# Patient Record
Sex: Male | Born: 1985 | Hispanic: Yes | Smoking: Never smoker
Health system: Southern US, Community
[De-identification: ages and names within clinical notes are randomized; demographics above are authoritative.]

## PROBLEM LIST (undated history)

## (undated) HISTORY — PX: OTHER SURGICAL HISTORY: SHX169

---

## 2015-03-02 ENCOUNTER — Emergency Department (HOSPITAL_COMMUNITY)
Admission: EM | Admit: 2015-03-02 | Discharge: 2015-03-02 | Disposition: A | Discharge status: Home / Self Care | Payer: Self-pay | Attending: Emergency Medicine | Admitting: Emergency Medicine

## 2015-03-02 ENCOUNTER — Encounter (HOSPITAL_COMMUNITY): Payer: Self-pay | Admitting: Emergency Medicine

## 2015-03-02 DIAGNOSIS — A6920 Lyme disease, unspecified: Secondary | ICD-10-CM | POA: Insufficient documentation

## 2015-03-02 DIAGNOSIS — Z720 Tobacco use: Secondary | ICD-10-CM | POA: Insufficient documentation

## 2015-03-02 MED ORDER — DOXYCYCLINE HYCLATE 100 MG PO CAPS: 100.0000 mg | ORAL_CAPSULE | Freq: Two times a day (BID) | ORAL | Status: DC

## 2015-03-02 NOTE — ED Notes (Signed)
Large, red, raised, itching rash on l/lower leg. Area is a  7cm diameter, round ring. No other rash noted

## 2015-03-02 NOTE — Discharge Instructions (Signed)
Read the information below.  Use the prescribed medication as directed.  Please discuss all new medications with your pharmacist.  You may return to the Emergency Department at any time for worsening condition or any new symptoms that concern you.  If you develop fevers, body aches, vomiting, or difficulty breathing or swallowing return to the ER for a recheck.       Emergency Department Resource Guide 1) Find a Doctor and Pay Out of Pocket Although you won't have to find out who is covered by your insurance plan, it is a good idea to ask around and get recommendations. You will then need to call the office and see if the doctor you have chosen will accept you as a new patient and what types of options they offer for patients who are self-pay. Some doctors offer discounts or will set up payment plans for their patients who do not have insurance, but you will need to ask so you aren't surprised when you get to your appointment.  2) Contact Your Local Health Department Not all health departments have doctors that can see patients for sick visits, but many do, so it is worth a call to see if yours does. If you don't know where your local health department is, you can check in your phone book. The CDC also has a tool to help you locate your state's health department, and many state websites also have listings of all of their local health departments.  3) Find a Walk-in Clinic If your illness is not likely to be very severe or complicated, you may want to try a walk in clinic. These are popping up all over the country in pharmacies, drugstores, and shopping centers. They're usually staffed by nurse practitioners or physician assistants that have been trained to treat common illnesses and complaints. They're usually fairly quick and inexpensive. However, if you have serious medical issues or chronic medical problems, these are probably not your best option.  No Primary Care Doctor: - Call Health Connect at   [phone] - they can help you locate a primary care doctor that  accepts your insurance, provides certain services, etc. - Physician Referral Service- [phone]  Chronic Pain Problems: Organization         Address  Phone   Notes  [facility]  [phone] Patients need to be referred by their primary care doctor.   Medication Assistance: Organization         Address  Phone   Notes  [facility] [address] [place], [place] [address] [phone] --Must be a resident of [facility] [place] -- Must have NO insurance coverage whatsoever (no Medicaid/ Medicare, etc.) -- The pt. MUST have a primary care doctor that directs their care regularly and follows them in the community   MedAssist  [phone]) [phone]   [employer]  [phone]    Agencies that provide inexpensive medical care: Organization         Address  Phone   Notes  [facility]  [phone]   [facility]    [phone]   [facility] [address] [place], [place] [address] [phone]   [facility] [address], [place] [phone]   Planned Parenthood    [phone]) [phone]   [facility]    [phone]) [phone]   Community Health and [facility]  [address]  Mardene Speak Phone:  [phone], Fax:  [phone] Hours of Operation:  9 am - 6 pm, M-F.  Also accepts Medicaid/Medicare and self-pay.  [facility] for [place], [address], [place] Phone: [phone], Fax: [phone]. Hours of Operation:  8:30 am - 5:30 pm, M-F.  Also accepts Medicaid and self-pay.  [facility] [address] [place] Phone: [phone]   [place], [place], [place] [phone], Ext. [phone] Mondays & Thursdays: 7-9 AM.  First 15 patients are seen on a first come, first serve basis.     [place] Providers:  Organization         Address  Phone   Notes  [facility] [address], [place] [phone] Also accepts self-pay patients.  [facility] [id number] [place], [place]  [phone]   [place], [address], [place] [phone]   [facility] [address], [place] [phone]   Lucianne Lei [address], [place]   [phone] Only accepts [place] Access [place] patients after they have their name applied to their card.   Self-Pay (no insurance) in [facility]:  Organization         Address  Phone   Notes  Sickle Cell Patients, [facility] [place] [phone]   [facility] [place] [phone]   [facility]  [place], [place], [place] [phone]   [facility]/Dr. Osei-Bonsu  [address] [place] or [address], [place] [phone] Phone number for both [place] and [place] locations is the same.  Urgent Medical and [facility] [address], [place] [phone]   [facility] [address] [place] or [address] [phone]   [facility] [address], [place] [phone], phone; [phone], fax Sees patients 1st and 3rd Saturday of every month.  Must not qualify for public or private insurance (i.e. Medicaid, Medicare, [place] Health Choice, Veterans' Benefits)  Household income should be no more than 200% of the poverty level The clinic cannot treat you if you are pregnant or think you are pregnant  Sexually transmitted diseases are not treated at the clinic.    Dental Care: Organization         Address  Phone  Notes  [facility]  Department of [facility] [place] [phone] Accepts children up to age 58 who are enrolled in [place] or [place]; pregnant women with a Medicaid card; and children who have applied for Medicaid or [place] Health Choice, but were declined, whose parents can pay a reduced fee at time of service.  [facility] Department of [facility]  [address], [place] [phone] Accepts children up to age 34 who are enrolled in [place] or [place]; pregnant women with a Medicaid card; and children who have applied for Medicaid or [place] Health Choice, but were declined, whose parents can pay a reduced fee at time of service.  [employer] Adult Dental Access PROGRAM  [place] [phone] Patients are seen  by appointment only. Walk-ins are not accepted. [place] will see patients 43 years of age and older. Monday - Tuesday (8am-5pm) Most Wednesdays (8:30-5pm) $30 per visit, cash only  [facility] Adult Dental Access PROGRAM  [address], [facility] [phone] Patients are seen by appointment only. Walk-ins are not accepted. [place] will see patients 22 years of age and older. One Wednesday Evening (Monthly: Volunteer Based).  $30 per visit, cash only  [place]  [phone] for adults; Children under age 43, call Graduate Pediatric Dentistry at [phone]. Children aged 30-14, please call [phone] to request a pediatric application.  Dental services are provided in all areas of dental care including fillings, crowns and bridges, complete and partial dentures, implants, gum treatment, root canals, and extractions. Preventive care is also provided. Treatment is provided to both adults and children. Patients are selected via a lottery and there is often a waiting list.   [facility] [address], [place]  [phone]  [email or web address].drcivils.com   [facility] [address] [place], [place] [phone], Ext. [phone] Second and Fourth Thursday of each month, opens at 6:30 AM; Clinic ends at 9 AM.  Patients are seen on a first-come first-served basis, and a limited number are seen during each clinic.   [facility]  [address] Hillard Danker [place], [place] [phone]   Eligibility Requirements You must have lived in [place], [place], or [place] counties for at least the last three months.   You cannot be eligible for state or federal sponsored [employer], including [employer], [place], or [employer].   You generally cannot be eligible for healthcare insurance through your employer.    How to apply: Eligibility screenings are held every Tuesday and Wednesday afternoon from 1:00 pm until 4:00 pm. You do not need an appointment for the interview!  [facility] [address], [place], [place]   [place]  [place] Department  [place]  [phone]    Behavioral Health Resources in the Community: Intensive Outpatient Programs Organization         Address  Phone  Notes  [facility] [place]. [address], [place], [place] [phone]   [facility] Outpatient [address] [place], [place]   ADS: Alcohol & Drug Svcs [address] [place], [place]   [place] [address]  [place], [place] or [phone]   Substance Abuse Resources Organization         Address  Phone  Notes  Alcohol and Drug Services  [phone]   [place]  [phone]   [facility] [place]   Chinita Pester  [phone]   Residential & Outpatient Substance Abuse Program  [phone]   Psychological Services Organization          Address  Phone  Notes  [facility]  [place]  [phone]   [place] [address] [place] or [phone]    Mobile Crisis Teams Organization         Address  Phone  Notes  [facility]rapeutic Alternatives, Mobile Crisis Care Unit  [phone]   Assertive Psychotherapeutic Services  [address] [place], [place]   [facility] [address] [place]  [phone]    Self-Help/Support Groups Organization         Address  Phone             Notes  Mental Health Assoc. of [place] - variety of support groups  [place] Call for more information  Narcotics Anonymous (NA), [facility]aring [facility] [address], [employer] [place]  2 meetings at this location   Special educational needs teacher         Address  Phone  Notes  ASAP Residential Treatment [place],    [place]  [phone]   [facility]  [address], [place] [id number], [place], [place]   [place], [place] [phone] Admissions: 8am-3pm M-F  Incentives Substance [place] [address]    [place], [place] [phone]   [facility] [address] [place], [place], [place]   [facility] [address]  [place], [place]   [facility] Programs - Intensive Outpatient [address], [place], [place]   [facility] ([place].,  [place], [place] [phone] or [phone]   [facility] Services (RTS) [address], [place], [place] Accepts Medicaid  [facility] [place] [address]  [place] [phone] Substance Abuse/Addiction Treatment   [facility] Organization         Address  Phone  Notes  [facility]  [phone]   Domenic Schwab, PhD [address] Arlis Porta [place], [place]   [phone] or [phone]   [place] Worden K[address] [place], [place] [phone]   [facility] [address], [place], [place] [phone] Insurance/Medicaid/sponsorship through [facility] and Families [address]                                    [place], [place] [phone] [place] [address], [place] [phone]    Dr. Adele Schilder  [phone]   [facility] Dept. 1) [address] [place] 2) [place] 3)  [place] [address], [place] [phone]  [phone]   [place] [phone] or [phone] (After Hours)

## 2015-03-02 NOTE — ED Provider Notes (Signed)
CSN: [id number]     Arrival date & time 03/02/15  1042 History   First MD Initiated Contact with Patient 03/02/15 1056     Chief Complaint  Patient presents with  . Rash    4 day hx of large raised rash on l/lower leg     (Consider location/radiation/quality/duration/timing/severity/associated sxs/prior Treatment) The history is provided by the patient.   Pt presents with single skin lesion to his left lower leg.  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  The lesion does not itch or hurt.  He denies any injury to the area.  Denies fevers, chills, SOB, cough, joint pain, myalgias.  He has used no treatment for the lesion.     History reviewed. No pertinent past medical history. Past Surgical History  Procedure Laterality Date  . Elbow sugery     History reviewed. No pertinent family history. History  Substance Use Topics  . Smoking status: Current Some Day Smoker  . Smokeless tobacco: Not on file  . Alcohol Use: Yes    Review of Systems  All other systems reviewed and are negative.     Allergies  Review of patient's allergies indicates no known allergies.  Home Medications   Prior to Admission medications   Not on File   BP 115/69 mmHg  Pulse 63  Temp(Src) 98.2 F (36.8 C) (Oral)  Resp 16  Wt 140 lb (63.504 kg)  SpO2 98% Physical Exam  Constitutional: He appears well-developed and well-nourished. No distress.  HENT:  Head: Normocephalic and atraumatic.  Neck: Neck supple.  Cardiovascular: Normal rate and regular rhythm.   Pulmonary/Chest: Effort normal and breath sounds normal. No respiratory distress. He has no wheezes. He has no rales.  Neurological: He is alert.  Skin: He is not diaphoretic.     Psychiatric: He has a normal mood and affect.  Nursing note and vitals reviewed.      ED Course  Procedures (including critical care time) Labs Review Labs Reviewed - No data to display  Imaging Review No  results found.   EKG Interpretation None      MDM   Final diagnoses:  Erythema migrans (Lyme disease)    Afebrile, nontoxic patient with single lesion, as above.  Concern for erythema migrans.  Pt without symptoms.   D/C home with doxycycline.  Discussed result, findings, treatment, and follow up  with patient.  Pt given return precautions.  Pt verbalizes understanding and agrees with plan.         Trixie Dredgemily Prathik Aman, PA-C 03/02/15 1418  Nelva Nayobert Beaton, MD 03/04/15 [phone]

## 2015-03-02 NOTE — Progress Notes (Signed)
CM spoke with pt who confirms self pay [place] with no [place]cp.  CM discussed and provided written information for self pay pcps, discussed the importance of pcp vs EDP services for f/u care, www.needymeds.org, www.goodrx.com, discounted pharmacies and other [place] resources such as [facility] , [facility], affordable care act,  [place] med assist, financial assistance, self pay dental services, [place] med assist, DSS and  health department  Reviewed resources for [place] self pay pcps like Jovita KussmaulEvans [place], family medicine at [address], [facility], [facility], local urgent care centers, [facility], [facility] family practice, general medical clinics, family services of the [place], [facility] urgent care plus others, medication resources, [facility] out patient pharmacies and housing Pt voiced understanding and appreciation of resources provided   Provided P4CC contact information Pt states he speaks little English Pt given a Spanish translates list of the above mentioned resources

## 2020-10-02 ENCOUNTER — Other Ambulatory Visit: Payer: Self-pay

## 2020-10-02 ENCOUNTER — Encounter (HOSPITAL_BASED_OUTPATIENT_CLINIC_OR_DEPARTMENT_OTHER): Payer: Self-pay | Admitting: Emergency Medicine

## 2020-10-02 ENCOUNTER — Emergency Department (HOSPITAL_BASED_OUTPATIENT_CLINIC_OR_DEPARTMENT_OTHER): Payer: No Typology Code available for payment source

## 2020-10-02 ENCOUNTER — Emergency Department (HOSPITAL_BASED_OUTPATIENT_CLINIC_OR_DEPARTMENT_OTHER)
Admission: EM | Admit: 2020-10-02 | Discharge: 2020-10-02 | Disposition: A | Discharge status: Home / Self Care | Payer: No Typology Code available for payment source | Attending: Emergency Medicine | Admitting: Emergency Medicine

## 2020-10-02 DIAGNOSIS — S39012A Strain of muscle, fascia and tendon of lower back, initial encounter: Secondary | ICD-10-CM

## 2020-10-02 DIAGNOSIS — S3992XA Unspecified injury of lower back, initial encounter: Secondary | ICD-10-CM | POA: Diagnosis present

## 2020-10-02 DIAGNOSIS — Y92410 Unspecified street and highway as the place of occurrence of the external cause: Secondary | ICD-10-CM | POA: Diagnosis not present

## 2020-10-02 LAB — URINALYSIS, ROUTINE W REFLEX MICROSCOPIC
Bilirubin Urine: NEGATIVE
Glucose, UA: NEGATIVE mg/dL
Hgb urine dipstick: NEGATIVE
Ketones, ur: NEGATIVE mg/dL
Leukocytes,Ua: NEGATIVE
Nitrite: NEGATIVE
Protein, ur: NEGATIVE mg/dL
Specific Gravity, Urine: 1.03 (ref 1.005–1.030)
pH: 6 (ref 5.0–8.0)

## 2020-10-02 MED ORDER — IBUPROFEN 400 MG PO TABS
400.0000 mg | ORAL_TABLET | Freq: Once | ORAL | Status: AC
  Administered 2020-10-02: 400 mg via ORAL
  Filled 2020-10-02: qty 1
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
## 2020-10-02 NOTE — Discharge Instructions (Addendum)
Aplique hielo durante treinta minutos a la vez, cuatro veces al [employer].  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 Si necesita un alivio adicional del dolor, agregue acetaminofn.
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
## 2020-10-02 NOTE — ED Notes (Signed)
Patient verbalizes understanding of discharge instructions. Opportunity for questioning and answers were provided. Armband removed by staff, pt discharged from ED ambulatory to home.  

## 2021-05-10 ENCOUNTER — Other Ambulatory Visit: Payer: Self-pay

## 2021-05-11 ENCOUNTER — Ambulatory Visit: Payer: Self-pay | Admitting: Family Medicine

## 2021-07-01 ENCOUNTER — Telehealth (INDEPENDENT_AMBULATORY_CARE_PROVIDER_SITE_OTHER): Payer: Self-pay | Admitting: Nurse Practitioner

## 2021-07-01 ENCOUNTER — Other Ambulatory Visit: Payer: Self-pay

## 2021-07-01 DIAGNOSIS — G8929 Other chronic pain: Secondary | ICD-10-CM

## 2021-07-01 DIAGNOSIS — M2351 Chronic instability of knee, right knee: Secondary | ICD-10-CM

## 2021-07-01 DIAGNOSIS — M25561 Pain in right knee: Secondary | ICD-10-CM

## 2021-07-01 NOTE — Progress Notes (Signed)
Virtual Visit via Telephone Note  I connected with Miguel Jefferson on 07/01/21 at 10:40 AM EDT by telephone and verified that I am speaking with the correct person using two identifiers.  Location: Patient: home Provider: office   I discussed the limitations, risks, security and privacy concerns of performing an evaluation and management service by telephone and the availability of in person appointments. I also discussed with the patient that there may be a patient responsible charge related to this service. The patient expressed understanding and agreed to proceed.   History of Present Illness:  Patient presents today for right knee pain through telephone visit.  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  He states that he also noticed some right knee instability.  He states it feels like his knee dislocates at times. Denies f/c/s, n/v/d, hemoptysis, PND, chest pain or edema.       Observations/Objective:  Vitals with BMI 10/02/2020 10/02/2020 10/02/2020  Height - - 5' 6"  Weight - - 140 lbs  BMI - - 12.87  Systolic 867 672 -  Diastolic 88 93 -  Pulse 66 80 -      Assessment and Plan:  Right knee pain:  Will place referral to orthopedics for further evaluation  May use knee sleeve for comfort and stability  May alternate heat and ice  Follow up:  Follow up with PCP in 2-3 weeks  Patient Instructions  Right knee pain:  Will place referral to orthopedics for further evaluation  May use knee sleeve for comfort and stability  May alternate heat and ice  Follow up:  Follow up with PCP in 2-3 weeks  Chronic Knee Pain, Adult Knee pain that lasts longer than 3 months is called chronic knee pain. You may have pain in one or both knees. Symptoms of chronic knee pain may also include swelling and stiffness. The most common cause is age-related wear and tear (osteoarthritis) of your knee joint. Many conditions can cause  chronic knee pain. Treatment depends on the cause. The main treatments are physical therapy and weight loss. It may also be treated with medicines, injections, a knee sleeve or brace, and by using crutches. Rest, ice, pressure (compression), and elevation, also known as RICE therapy, may also be recommended. Follow these instructions at home: If you have a knee sleeve or brace:  Wear the knee sleeve or brace as told by your doctor. Take it off only as told by your doctor. Loosen it if your toes: Tingle. Become numb. Turn cold and blue. Keep it clean. If the sleeve or brace is not waterproof: Do not let it get wet. Ask your doctor if you may take it off when you take a bath or shower. If not, cover it with a watertight covering. Managing pain, stiffness, and swelling   If told, put heat on your knee. Do this as often as told by your doctor. Use the heat source that your doctor recommends, such as a moist heat pack or a heating pad. If you have a removable knee sleeve or brace, take it off as told by your doctor. Place a towel between your skin and the heat source. Leave the heat on for 20-30 minutes. Take off the heat if your skin turns bright red. This is very important. If you cannot feel pain, heat, or cold, you have a greater risk of getting burned. If told, put ice on your knee. To do  this: If you have a removable knee sleeve or brace, take it off as told by your doctor. Put ice in a plastic bag. Place a towel between your skin and the bag. Leave the ice on for 20 minutes, 2-3 times a day. Take off the ice if your skin turns bright red. This is very important. If you cannot feel pain, heat, or cold, you have a greater risk of damage to the area. Move your toes often. Raise the injured area above the level of your heart while you are sitting or lying down. Activity Avoid activities where both feet leave the ground at the same time (high-impact activities). Examples are running,  jumping rope, and doing jumping jacks. Follow the exercise plan that your doctor makes for you. Your doctor may suggest that you: Avoid activities that make knee pain worse. You may need to change the exercises that you do, the sports that you participate in, or your job duties. Wear shoes with cushioned soles. Avoid sports that require running and sudden changes in direction. Do exercises or physical therapy. This is planned to match your needs and your abilities. Do exercises that increase your balance and strength, such as tai chi and yoga. Do not use your injured knee to support your body weight until your doctor says that you can. Use crutches as told by your doctor. Return to your normal activities when your doctor says that it is safe. General instructions Take over-the-counter and prescription medicines only as told by your doctor. If you are overweight, work with your doctor and a food expert (dietitian) to set goals to lose weight. Being overweight can make your knee hurt more. Do not smoke or use any products that contain nicotine or tobacco. If you need help quitting, ask your doctor. Keep all follow-up visits. Contact a doctor if: You have knee pain that is not getting better or gets worse. You are not able to do your exercises due to knee pain. Get help right away if: Your knee swells and the swelling gets worse. You cannot move your knee. You have very bad knee pain. Summary Knee pain that lasts more than 3 months is called chronic knee pain. The main treatments for chronic knee pain are physical therapy and weight loss. You may also need to take medicines, wear a knee sleeve or brace, use crutches, and put ice or heat on your knee. Lose weight if you are overweight. Work with your doctor and a food expert (dietitian) to help you set goals to lose weight. Being overweight can make your knee hurt more. Follow the exercise plan that your doctor makes for you. This information  is not intended to replace advice given to you by your health care provider. Make sure you discuss any questions you have with your health care provider. Document Revised: 01/28/2020 Document Reviewed: 01/28/2020 Elsevier Patient Education  2022 [employer].     I discussed the assessment and treatment plan with the patient. The patient was provided an opportunity to ask questions and all were answered. The patient agreed with the plan and demonstrated an understanding of the instructions.   The patient was advised to call back or seek an in-person evaluation if the symptoms worsen or if the condition fails to improve as anticipated.  I provided 23 minutes of non-face-to-face time during this encounter.   Fenton Foy, NP

## 2021-07-05 ENCOUNTER — Encounter: Payer: Self-pay | Admitting: Nurse Practitioner

## 2021-07-05 NOTE — Patient Instructions (Addendum)
Right knee pain:  Will place referral to orthopedics for further evaluation  May use knee sleeve for comfort and stability  May alternate heat and ice  Follow up:  Follow up with PCP in 2-3 weeks  Chronic Knee Pain, Adult Knee pain that lasts longer than 3 months is called chronic knee pain. You may have pain in one or both knees. Symptoms of chronic knee pain may also include swelling and stiffness. The most common cause is age-related wear and tear (osteoarthritis) of your knee joint. Many conditions can cause chronic knee pain. Treatment depends on the cause. The main treatments are physical therapy and weight loss. It may also be treated with medicines, injections, a knee sleeve or brace, and by using crutches. Rest, ice, pressure (compression), and elevation, also known as RICE therapy, may also be recommended. Follow these instructions at home: If you have a knee sleeve or brace:  Wear the knee sleeve or brace as told by your doctor. Take it off only as told by your doctor. Loosen it if your toes: Tingle. Become numb. Turn cold and blue. Keep it clean. If the sleeve or brace is not waterproof: Do not let it get wet. Ask your doctor if you may take it off when you take a bath or shower. If not, cover it with a watertight covering. Managing pain, stiffness, and swelling   If told, put heat on your knee. Do this as often as told by your doctor. Use the heat source that your doctor recommends, such as a moist heat pack or a heating pad. If you have a removable knee sleeve or brace, take it off as told by your doctor. Place a towel between your skin and the heat source. Leave the heat on for 20-30 minutes. Take off the heat if your skin turns bright red. This is very important. If you cannot feel pain, heat, or cold, you have a greater risk of getting burned. If told, put ice on your knee. To do this: If you have a removable knee sleeve or brace, take it off as told by your  doctor. Put ice in a plastic bag. Place a towel between your skin and the bag. Leave the ice on for 20 minutes, 2-3 times a day. Take off the ice if your skin turns bright red. This is very important. If you cannot feel pain, heat, or cold, you have a greater risk of damage to the area. Move your toes often. Raise the injured area above the level of your heart while you are sitting or lying down. Activity Avoid activities where both feet leave the ground at the same time (high-impact activities). Examples are running, jumping rope, and doing jumping jacks. Follow the exercise plan that your doctor makes for you. Your doctor may suggest that you: Avoid activities that make knee pain worse. You may need to change the exercises that you do, the sports that you participate in, or your job duties. Wear shoes with cushioned soles. Avoid sports that require running and sudden changes in direction. Do exercises or physical therapy. This is planned to match your needs and your abilities. Do exercises that increase your balance and strength, such as tai chi and yoga. Do not use your injured knee to support your body weight until your doctor says that you can. Use crutches as told by your doctor. Return to your normal activities when your doctor says that it is safe. General instructions Take over-the-counter and prescription medicines only as  told by your doctor. If you are overweight, work with your doctor and a food expert (dietitian) to set goals to lose weight. Being overweight can make your knee hurt more. Do not smoke or use any products that contain nicotine or tobacco. If you need help quitting, ask your doctor. Keep all follow-up visits. Contact a doctor if: You have knee pain that is not getting better or gets worse. You are not able to do your exercises due to knee pain. Get help right away if: Your knee swells and the swelling gets worse. You cannot move your knee. You have very bad  knee pain. Summary Knee pain that lasts more than 3 months is called chronic knee pain. The main treatments for chronic knee pain are physical therapy and weight loss. You may also need to take medicines, wear a knee sleeve or brace, use crutches, and put ice or heat on your knee. Lose weight if you are overweight. Work with your doctor and a food expert (dietitian) to help you set goals to lose weight. Being overweight can make your knee hurt more. Follow the exercise plan that your doctor makes for you. This information is not intended to replace advice given to you by your health care provider. Make sure you discuss any questions you have with your health care provider. Document Revised: 01/28/2020 Document Reviewed: 01/28/2020 Elsevier Patient Education  2022 [employer].

## 2021-07-08 ENCOUNTER — Encounter: Payer: Self-pay | Admitting: Orthopaedic Surgery

## 2021-07-08 ENCOUNTER — Other Ambulatory Visit: Payer: Self-pay

## 2021-07-08 ENCOUNTER — Ambulatory Visit: Payer: Self-pay

## 2021-07-08 ENCOUNTER — Ambulatory Visit: Payer: Self-pay | Admitting: Orthopaedic Surgery

## 2021-07-08 DIAGNOSIS — M25561 Pain in right knee: Secondary | ICD-10-CM

## 2021-07-08 NOTE — Progress Notes (Signed)
   Office Visit Note   Patient: Miguel Jefferson           Date of Birth: July 11, 1986           MRN: [id number] Visit Date: 07/08/2021              Requested by: Ivonne Andrew, NP [address] [place],  [place] [address] PCP: Patient, No Pcp Per (Inactive)   Assessment & Plan: Visit Diagnoses:  1. Right knee pain, unspecified chronicity     Plan: Impression is right knee ACL tear.  Given that this injury occurred little over 3 and half years from now, I do not feel as though need to immobilize him in a knee immobilizer.  We will however go ahead and order an MRI of the right knee to assess his ACL.  He will follow-up with [place] once this is been completed.  This was discussed through Spanish-speaking interpreter.  Follow-Up Instructions: Return for after right knee MRI.   Orders:  Orders Placed This Encounter  Procedures   XR KNEE 3 VIEW RIGHT   No orders of the defined types were placed in this encounter.     Procedures: No procedures performed   Clinical Data: No additional findings.   Subjective: Chief Complaint  Patient presents with   Right Knee - Pain    HPI patient is a very pleasant 35 year old Spanish-speaking gentleman who is here with right knee pain and instability.  This began about 3-1/2 years ago while playing soccer.  He collided with another player causing the right knee to give way.  There is never seen by physician following this injury.  He feels as though his symptoms improved and so went back to playing 20 years later his knee gave way again.  He complains of minimal pain to the medial aspect.  His main symptom is instability which causes a fair amount of apprehension with walking or any activity.  He denies any mechanical symptoms.  He does not take medication for this.  Review of Systems as detailed in HPI.  All others reviewed and are negative.   Objective: Vital Signs: There were no vitals taken for this visit.  Physical Exam well-developed  well-nourished gentleman in no acute distress.  Alert and oriented x3.  Ortho Exam right knee exam shows no effusion.  Range of motion 0 to 125 degrees.  No joint line tenderness.  He is stable valgus varus stress.  No tenderness along the proximal tibia.  He does have laxity with anterior drawer and Lachman testing.  He is neurovascular intact distally.  Specialty Comments:  No specialty comments available.  Imaging: XR KNEE 3 VIEW RIGHT  Result Date: 07/08/2021 X-rays demonstrate a remote segond fracture.    PMFS History: There are no problems to display for this patient.  History reviewed. No pertinent past medical history.  History reviewed. No pertinent family history.  Past Surgical History:  Procedure Laterality Date   elbow sugery     Social History   Occupational History   Not on file  Tobacco Use   Smoking status: Never   Smokeless tobacco: Never  Substance and Sexual Activity   Alcohol use: Yes   Drug use: Not on file   Sexual activity: Not on file

## 2021-07-20 ENCOUNTER — Telehealth: Payer: Self-pay | Admitting: Orthopaedic Surgery

## 2021-07-20 NOTE — Telephone Encounter (Signed)
Called patient to set up date for MRI review. Patient does not speak english. Miguel Jefferson can you please call patient for me and set up appointment? Patient is having the MRI on 08/08/2021.

## 2021-07-20 NOTE — Telephone Encounter (Signed)
Called patient no answer LMOM in spanish. Needs appt to review MRI results. MRI scheduled for 08/08/2021 & usually takes 1-2 days to get report.

## 2021-07-27 NOTE — Telephone Encounter (Signed)
Called patient again no answer LMOM. Needs appt with Dr. Roda Shutters to review MRI . MRI scheduled 08/08/2021.

## 2021-07-28 NOTE — Telephone Encounter (Signed)
Called Patient no answer

## 2021-08-08 ENCOUNTER — Ambulatory Visit
Admission: RE | Admit: 2021-08-08 | Discharge: 2021-08-08 | Disposition: A | Discharge status: Home / Self Care | Payer: Self-pay | Source: Ambulatory Visit | Attending: Orthopaedic Surgery | Admitting: Orthopaedic Surgery

## 2021-08-08 ENCOUNTER — Other Ambulatory Visit: Payer: Self-pay

## 2021-08-08 DIAGNOSIS — M25561 Pain in right knee: Secondary | ICD-10-CM

## 2021-08-09 ENCOUNTER — Telehealth: Payer: Self-pay | Admitting: Orthopaedic Surgery

## 2021-08-09 NOTE — Telephone Encounter (Signed)
Tried to call pt, would ring twice then drop the call. Tried to call pt to sch an mri follow up appt with Xu at next open option.

## 2021-08-09 NOTE — Progress Notes (Signed)
Needs next available appt please.  No urgency.  Thanks.

## 2022-11-23 IMAGING — MR MR KNEE*R* W/O CM
4 of 7 series · 18 of 40 positions shown · non-contrast
Comparison: None.

CLINICAL DATA: Right knee pain and instability x 3 years.

EXAM:
MRI OF THE RIGHT KNEE WITHOUT CONTRAST
TECHNIQUE: Multiplanar, multisequence MR imaging of the knee was performed. No
intravenous contrast was administered.

[Series 3: T2 fat-sat · axial · 4.0mm · 0.29mm/px · z∈[-67,+12]mm · 3 of 28 slices shown]
[im 5/28]
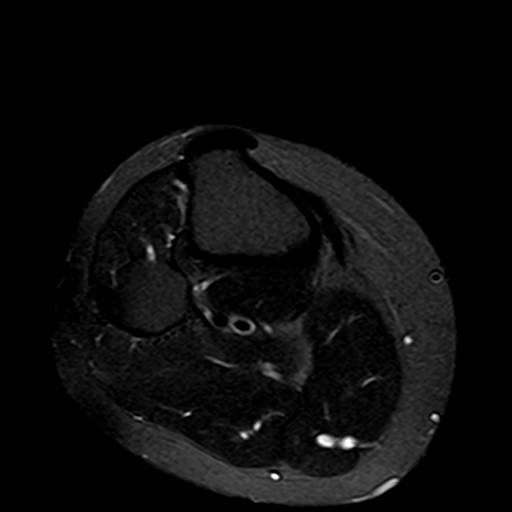
[im 14/28]
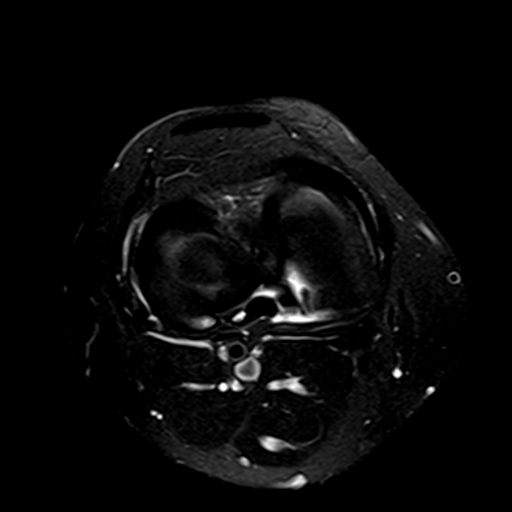
[im 23/28]
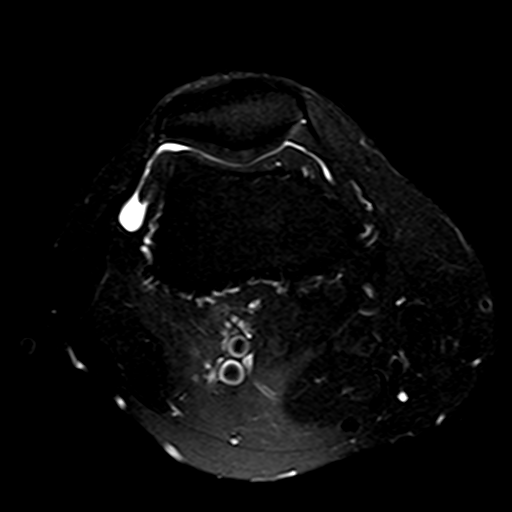

[Series 6: PD fat-sat · coronal · 3.0mm · 0.29mm/px · 6 of 28 slices shown (1 of 3)]
[im 1/28]
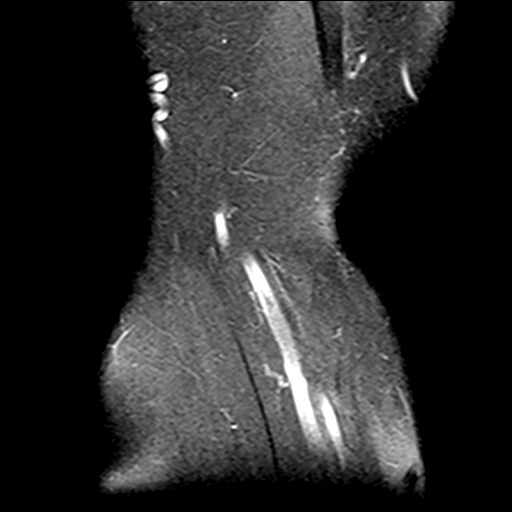
[im 6/28]
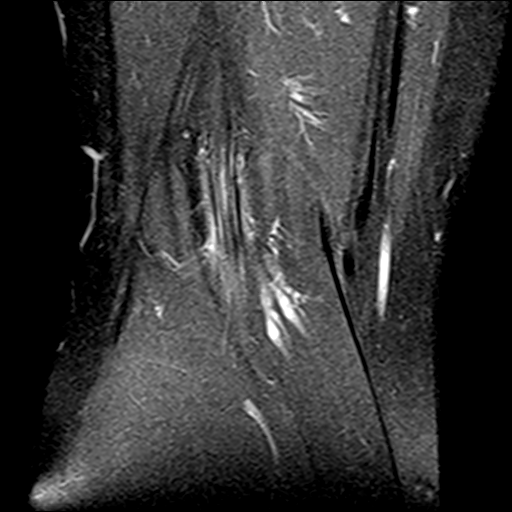
[im 11/28]
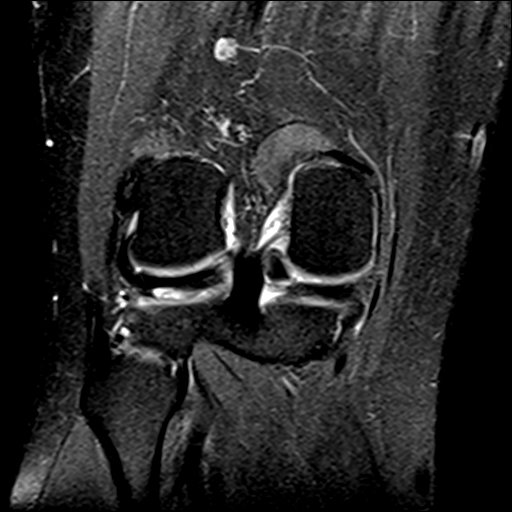
[im 17/28]
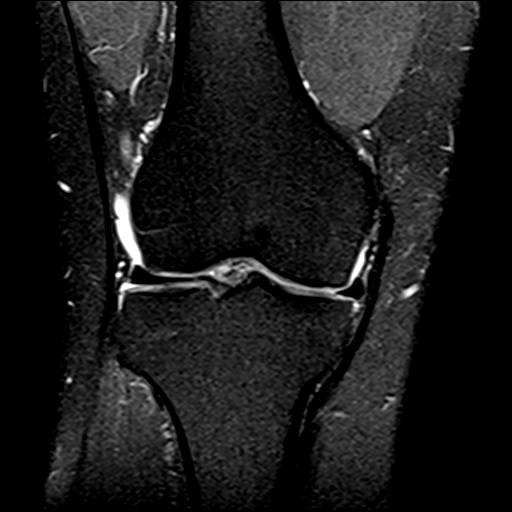
[im 22/28]
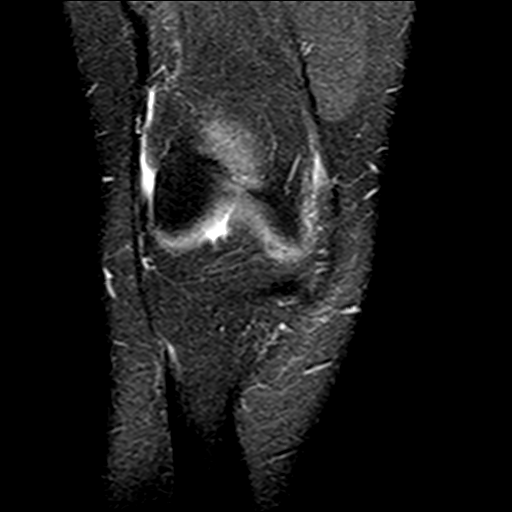
[im 28/28]
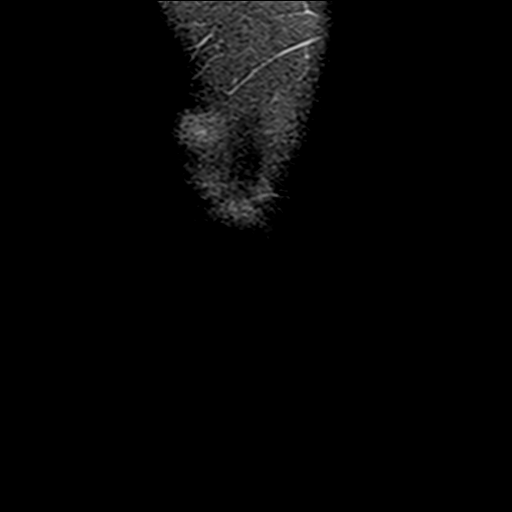

[Series 7: PD fat-sat · sagittal · 3.0mm · 0.29mm/px · 6 of 30 slices shown (2 of 3)]
[im 1/30]
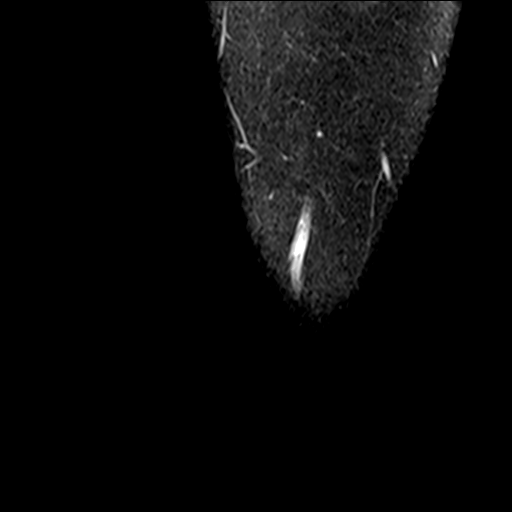
[im 5/30]
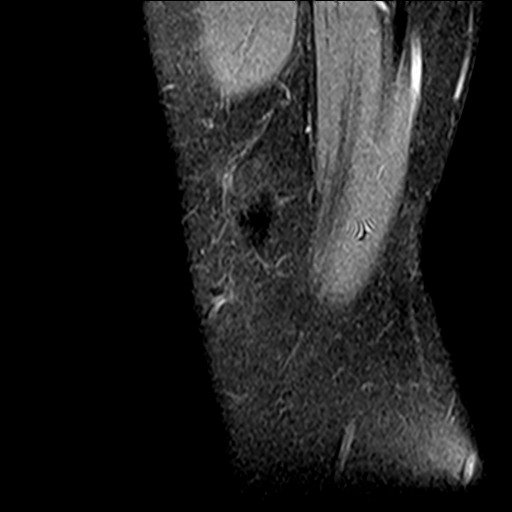
[im 10/30]
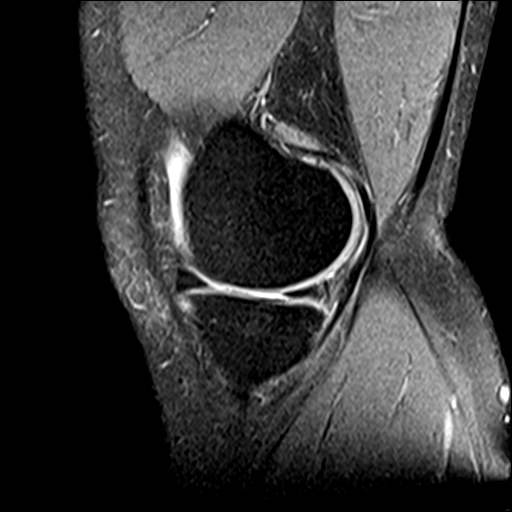
[im 15/30]
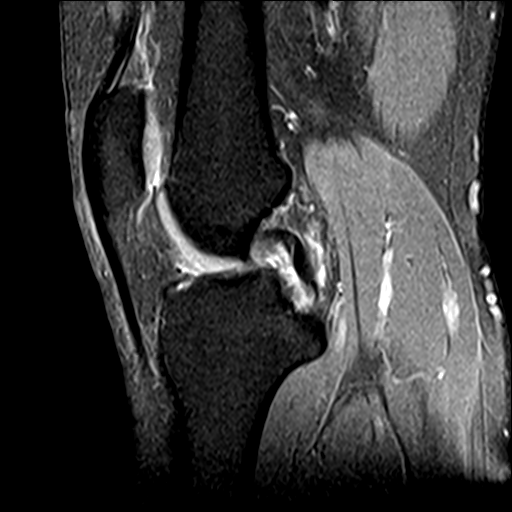
[im 20/30]
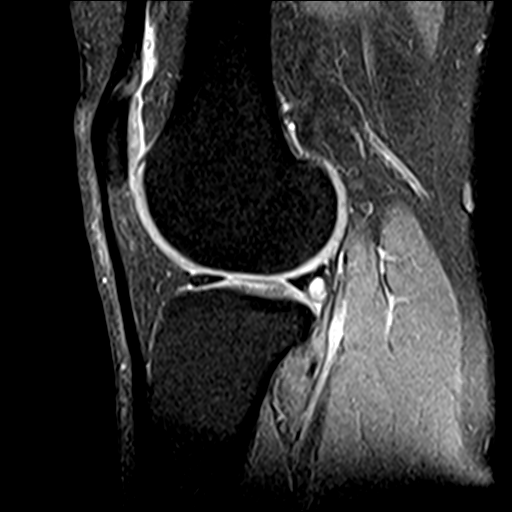
[im 25/30]
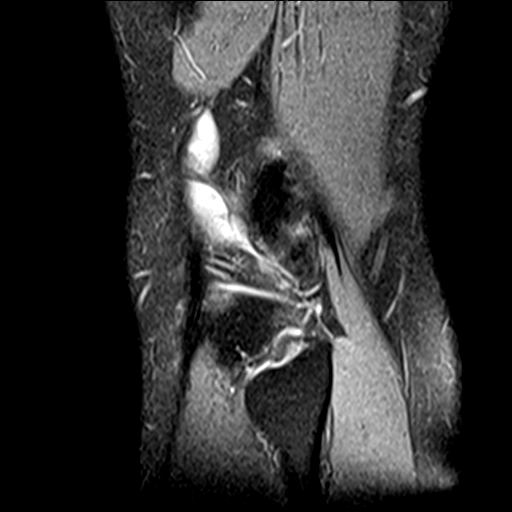

[Series 9: PD fat-sat · oblique · 2.0mm · 0.29mm/px · 3 of 13 slices shown (3 of 3)]
[im 1/13]
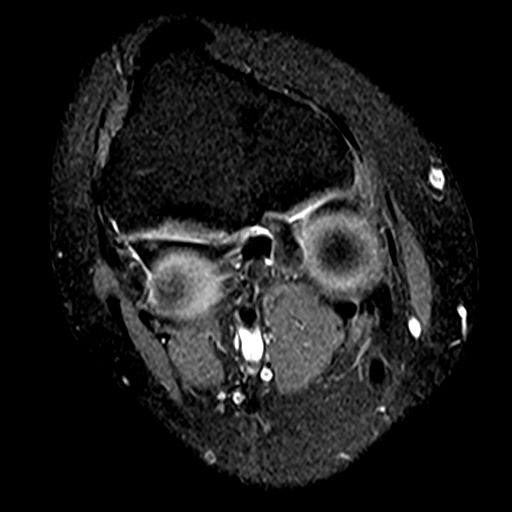
[im 7/13]
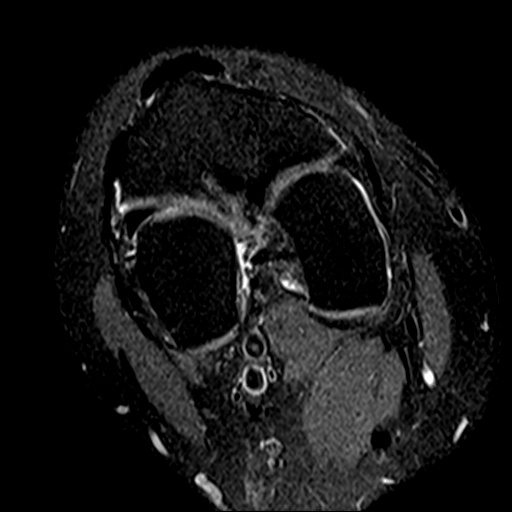
[im 13/13]
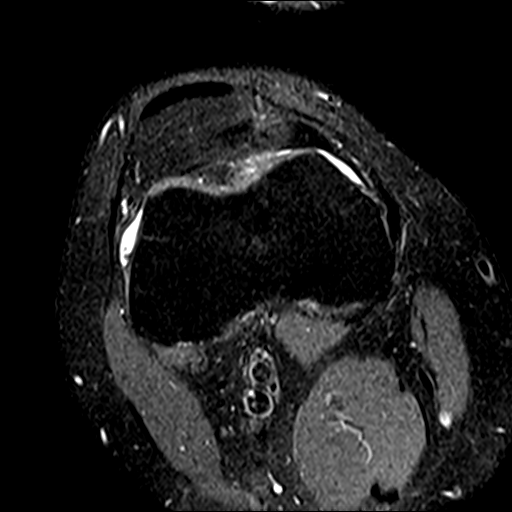

[18 of 40 positions shown; findings below may reference images not displayed]

FINDINGS: MENISCI

Medial: Complex tear of the body of the medial meniscus. Radial tear
of the root of the posterior horn of the medial meniscus with
peripheral meniscal extrusion. Flap of meniscal tissue near the
posterior horn of the medial meniscus towards the root that is
flipped towards the intercondylar notch.

Lateral: Vertical tear of the posterior horn of the lateral meniscus
with a 6 mm intrameniscal cyst extending towards the root and a
horizontal linear component extending to the posterior horn-body
junction.

LIGAMENTS

Cruciates: Complete chronic ACL tear.  Intact PCL.

Collaterals: Chronic thickening of the medial collateral ligament
likely reflecting prior injury. No acute injury of the medial
collateral ligament. Lateral collateral ligament complex is intact.

CARTILAGE

Patellofemoral:  No chondral defect.

Medial: Partial-thickness cartilage loss of the medial femorotibial
compartment.

Lateral: Partial-thickness cartilage loss of the lateral femoral
condyle.

JOINT: Small joint effusion. Normal Mumtaz Salamanca. No plical
thickening.

POPLITEAL FOSSA: Popliteus tendon is intact. No Baker's cyst.

EXTENSOR MECHANISM: Intact quadriceps tendon. Intact patellar
tendon. Intact lateral patellar retinaculum. Intact medial patellar
retinaculum. Intact MPFL.

BONES: No aggressive osseous lesion. No fracture or dislocation.

Other: No fluid collection or hematoma. Muscles are normal.
IMPRESSION: 1. Complex tear of the body of the medial meniscus. Radial tear of
the root of the posterior horn of the medial meniscus with
peripheral meniscal extrusion. Flap of meniscal tissue near the
posterior horn of the medial meniscus towards the root that is
flipped towards the intercondylar notch
2. Vertical tear of the posterior horn of the lateral meniscus with
a 6 mm intrameniscal cyst extending towards the root and a
horizontal linear component extending to the posterior horn-body
junction.
3. Complete chronic ACL tear.
4. Partial-thickness cartilage loss of the medial femorotibial
compartment.
5. Partial-thickness cartilage loss of the lateral femoral condyle.
6. Small joint effusion.
